# Patient Record
Sex: Female | Born: 1985 | Race: White | Hispanic: No | Marital: Married
Health system: Southern US, Community
[De-identification: ages and names within clinical notes are randomized; demographics above are authoritative.]

---

## 2004-07-21 ENCOUNTER — Other Ambulatory Visit: Payer: Self-pay

## 2004-08-06 ENCOUNTER — Other Ambulatory Visit: Payer: Self-pay

## 2005-01-23 ENCOUNTER — Emergency Department: Payer: Self-pay | Admitting: Unknown Physician Specialty

## 2005-01-23 ENCOUNTER — Other Ambulatory Visit: Payer: Self-pay

## 2005-02-06 ENCOUNTER — Emergency Department: Payer: Self-pay | Admitting: General Practice

## 2005-02-07 ENCOUNTER — Emergency Department: Payer: Self-pay | Admitting: General Practice

## 2005-09-24 ENCOUNTER — Emergency Department: Payer: Self-pay | Admitting: Emergency Medicine

## 2005-09-29 ENCOUNTER — Emergency Department: Payer: Self-pay | Admitting: Emergency Medicine

## 2005-09-30 ENCOUNTER — Emergency Department: Payer: Self-pay | Admitting: Emergency Medicine

## 2005-10-03 ENCOUNTER — Ambulatory Visit: Payer: Self-pay | Admitting: General Surgery

## 2005-10-08 ENCOUNTER — Inpatient Hospital Stay: Payer: Self-pay | Admitting: Internal Medicine

## 2006-08-13 ENCOUNTER — Emergency Department: Payer: Self-pay | Admitting: Emergency Medicine

## 2006-10-10 ENCOUNTER — Emergency Department: Payer: Self-pay | Admitting: Emergency Medicine

## 2007-02-04 ENCOUNTER — Emergency Department: Payer: Self-pay | Admitting: Emergency Medicine

## 2007-05-23 ENCOUNTER — Emergency Department: Payer: Self-pay | Admitting: Emergency Medicine

## 2007-07-23 ENCOUNTER — Ambulatory Visit: Payer: Self-pay | Admitting: Emergency Medicine

## 2007-09-06 IMAGING — CT CT ABD-PELV W/ CM
1 of 2 series · 15 of 32 positions shown, 19 images · non-contrast
Comparison: none

REASON FOR EXAM: (1)  Lower abdominal pain, vomiting (IV contrast only)(2)
same
COMMENTS:   Do 10/09/05 per  Jim

[Series 2: soft tissue · axial · 0.70mm/px · z∈[-522,-142]mm · 15 of 87 slices shown, 19 images]
[im 7/87  soft-tissue]
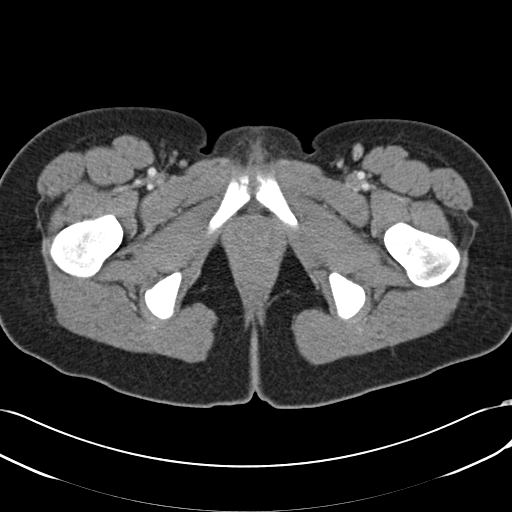
[im 7/87  bone]
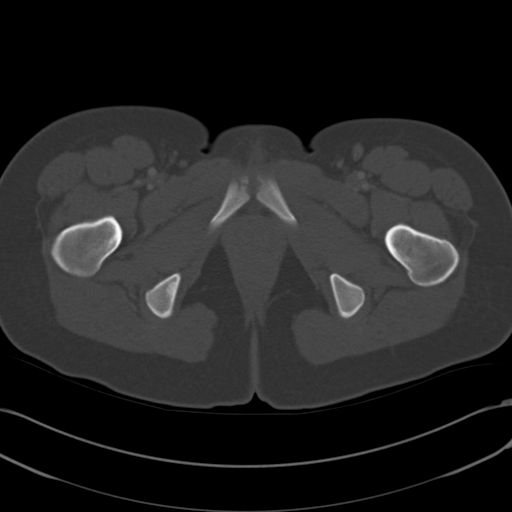
[im 13/87  soft-tissue]
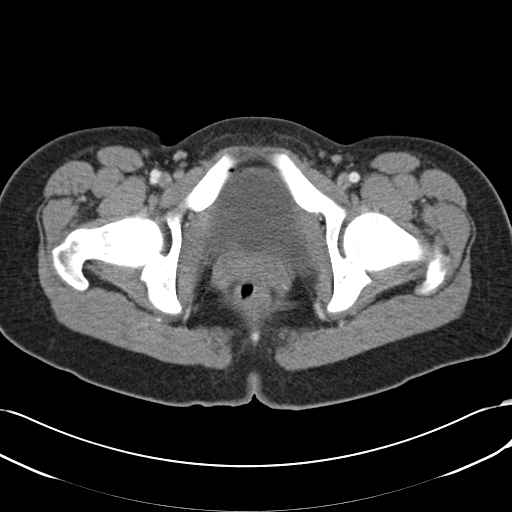
[im 19/87  soft-tissue]
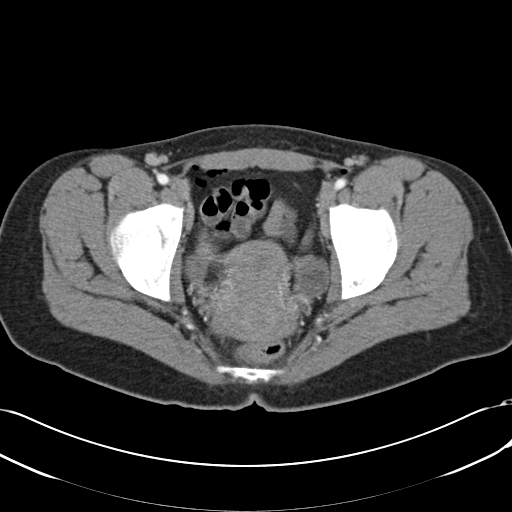
[im 25/87  soft-tissue]
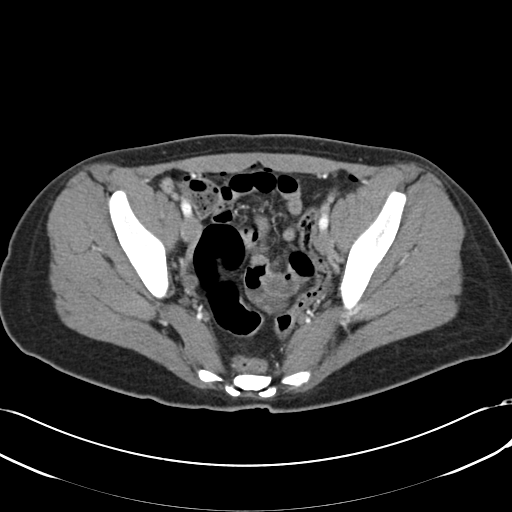
[im 31/87  soft-tissue]
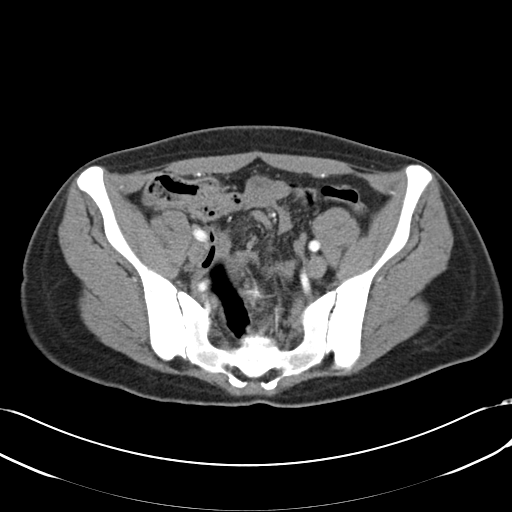
[im 37/87  soft-tissue]
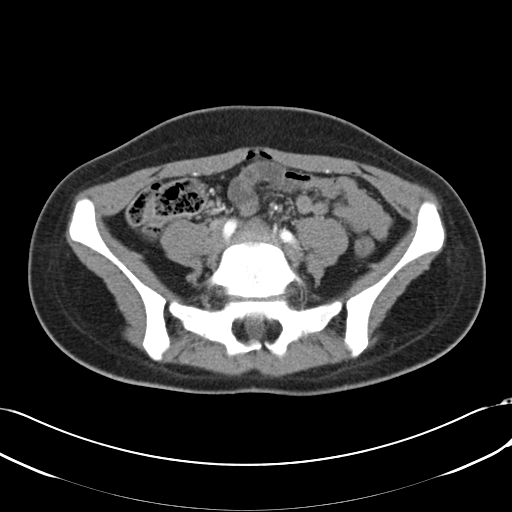
[im 44/87  soft-tissue]
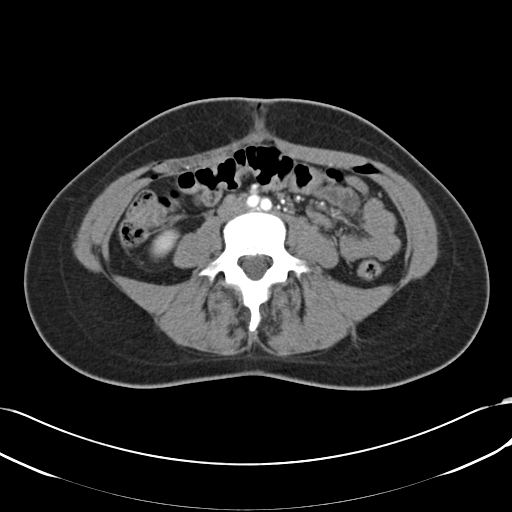
[im 50/87  soft-tissue]
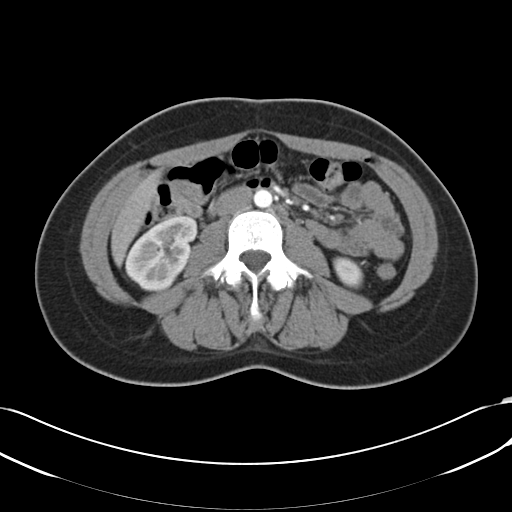
[im 56/87  soft-tissue]
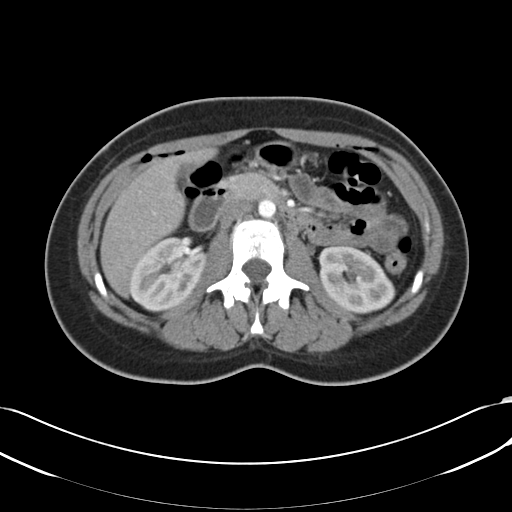
[im 56/87  bone]
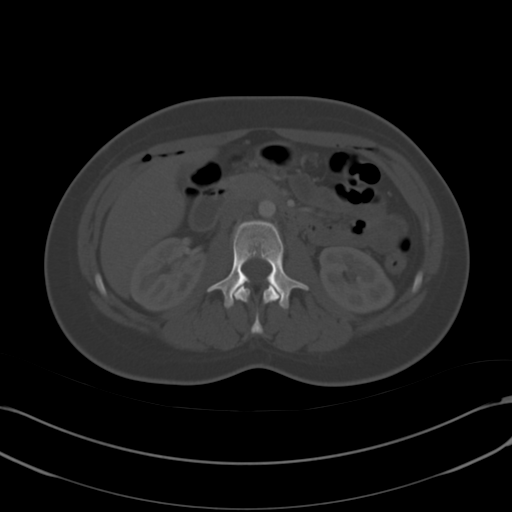
[im 62/87  soft-tissue]
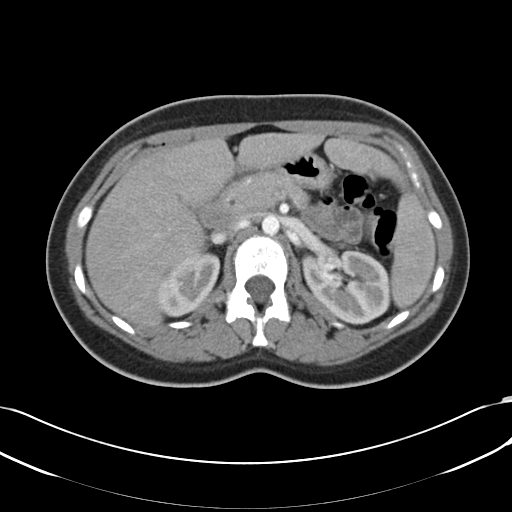
[im 68/87  soft-tissue]
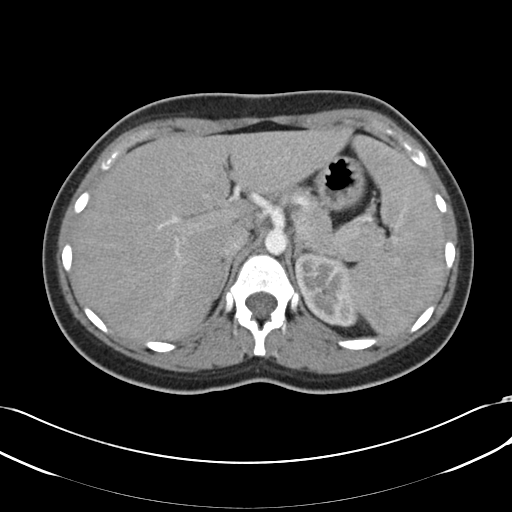
[im 74/87  soft-tissue]
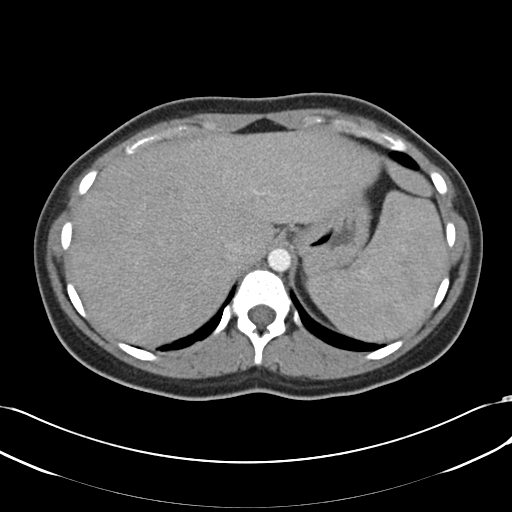
[im 74/87  lung]
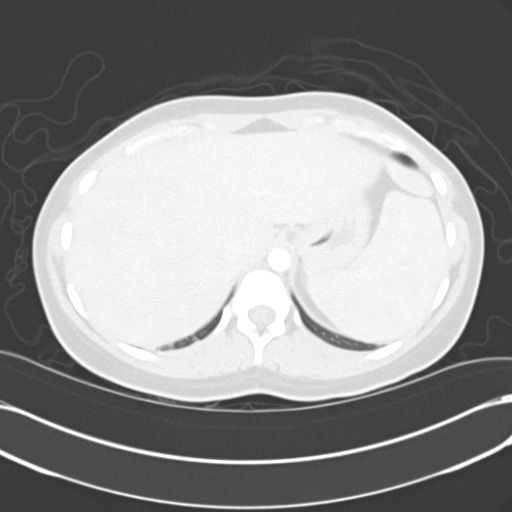
[im 77/87  lung]
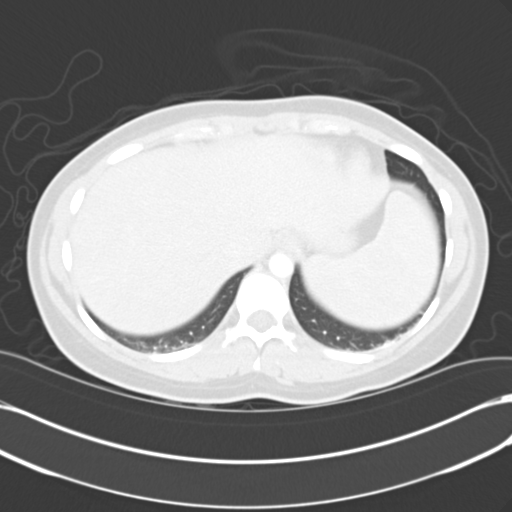
[im 80/87  soft-tissue]
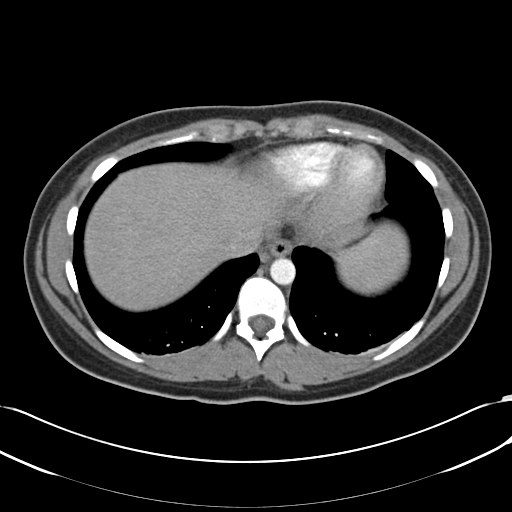
[im 80/87  lung]
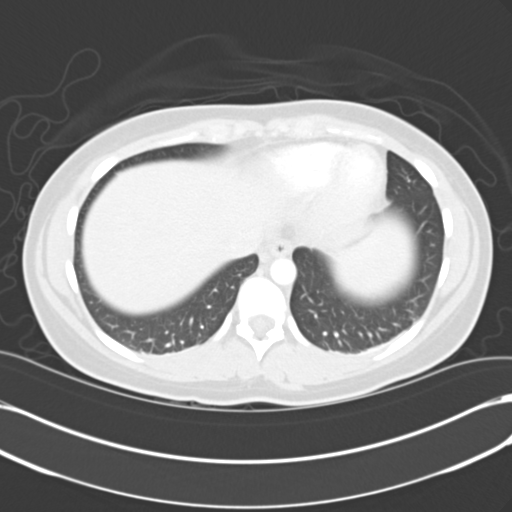
[im 83/87  lung]
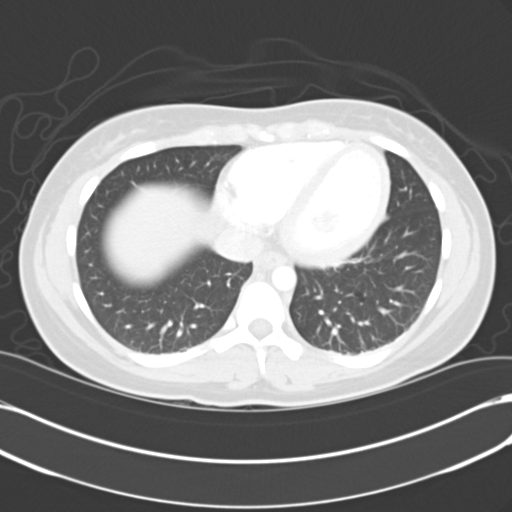

[15 of 32 positions shown; findings below may reference images not displayed]

PROCEDURE:     CT  - CT ABDOMEN / PELVIS  W  - October 09, 2005  [DATE]

RESULT:        IV contrast enhanced CT of the abdomen and pelvis was
performed.  Oral contrast was not administered.  There is intraperitoneal
free air. The appear to be some areas of free air in the subcutaneous
tissue.  The patient has had a recent laparoscopic cholecystectomy and
presumably free air should have resolved within the one week time since that
procedure.  If the procedure was more recent, this could account for some of
the residual free air seen intraperitoneally and in the muscular portion of
the anterior abdominal wall and periumbilical region.  Consideration for
perforation of a loop of bowel should be given but there is no definite
abscess.  Oral contrast would improve sensitivity for evaluation of loops of
bowel.  The aorta appears to be normal.  The kidneys enhance normally.   The
spleen, pancreas and liver are unremarkable.  No pneumothorax is evident.
The lung bases are clear.
IMPRESSION: Some areas of free air within the peritoneum. This may be secondary to the
patient's recent laparoscopic cholecystectomy.  The possibility of bowel
perforation cannot be completely excluded.  There is some air within
portions of the musculature of the anterior abdominal wall and in the
umbilical region in the soft tissue. These may be areas of port placement
during the laparoscopic cholecystectomy.  Again, oral contrast was not given
as per the request.  This would improve sensitivity for evaluation of the
bowel.  There is no evidence of free fluid or abscess formation. Correlation
with the patient's clinical condition would be recommended.  The abnormal
appearance of the RIGHT kidney suggestive of pyelonephritis on the earlier
study of 02/07/05 has resolved.

## 2007-12-02 ENCOUNTER — Ambulatory Visit: Payer: Self-pay | Admitting: Unknown Physician Specialty

## 2008-04-13 ENCOUNTER — Emergency Department: Payer: Self-pay | Admitting: Emergency Medicine

## 2008-09-28 ENCOUNTER — Emergency Department: Payer: Self-pay | Admitting: Emergency Medicine

## 2008-10-02 ENCOUNTER — Emergency Department: Payer: Self-pay | Admitting: Emergency Medicine

## 2008-10-21 ENCOUNTER — Emergency Department: Payer: Self-pay | Admitting: Emergency Medicine

## 2008-10-22 ENCOUNTER — Ambulatory Visit: Payer: Self-pay | Admitting: Unknown Physician Specialty

## 2008-12-19 ENCOUNTER — Emergency Department: Payer: Self-pay | Admitting: Emergency Medicine

## 2009-07-14 ENCOUNTER — Emergency Department: Payer: Self-pay | Admitting: Emergency Medicine

## 2011-06-05 ENCOUNTER — Observation Stay: Payer: Self-pay | Admitting: Surgery

## 2011-08-19 ENCOUNTER — Emergency Department: Payer: Self-pay | Admitting: Emergency Medicine

## 2012-05-25 ENCOUNTER — Emergency Department: Payer: Self-pay | Admitting: Internal Medicine

## 2013-05-22 ENCOUNTER — Emergency Department: Payer: Self-pay | Admitting: Emergency Medicine

## 2014-01-26 ENCOUNTER — Emergency Department: Payer: Self-pay | Admitting: Emergency Medicine

## 2014-05-19 ENCOUNTER — Emergency Department: Payer: Self-pay | Admitting: Emergency Medicine

## 2021-11-29 ENCOUNTER — Emergency Department
Admission: EM | Admit: 2021-11-29 | Discharge: 2021-12-04 | Disposition: E | Payer: Medicaid Other | Attending: Emergency Medicine | Admitting: Emergency Medicine

## 2021-11-29 DIAGNOSIS — I469 Cardiac arrest, cause unspecified: Secondary | ICD-10-CM | POA: Diagnosis not present

## 2021-11-29 MED ORDER — EPINEPHRINE 1 MG/10ML IJ SOSY
PREFILLED_SYRINGE | INTRAMUSCULAR | Status: AC | PRN
  Administered 2021-11-29 (×3): 1 mg via INTRAVENOUS

## 2021-11-29 MED ORDER — SODIUM BICARBONATE 8.4 % IV SOLN
INTRAVENOUS | Status: AC | PRN
  Administered 2021-11-29: 50 meq via INTRAVENOUS

## 2021-11-29 MED ORDER — CALCIUM CHLORIDE 10 % IV SOLN
INTRAVENOUS | Status: AC | PRN
  Administered 2021-11-29: 1 g via INTRAVENOUS

## 2021-11-29 NOTE — ED Triage Notes (Signed)
Pt arrives via [facility] with CPR in progress.

## 2021-11-29 NOTE — Progress Notes (Signed)
Death, prayer emotional support, ministry of presence.

## 2021-11-29 NOTE — ED Provider Notes (Signed)
[facility] Emergency Department Provider Note   ____________________________________________   I have reviewed the triage vital signs and the nursing notes.   HISTORY  Chief Complaint Cardiopulmonary Arrest   History limited by: Cardiopulmonary Arrest   HPI Julie Hull is a 35 y.o. female who presents to the emergency department today via EMS as emergency traffic due to cardiopulmonary arrest.  Report from EMS states that they have been called to her the patient's house earlier in the day for agitation.  At that time the patient was refusing transport to the ED.  She had apparently gone to the doctor's office earlier today for plaints of nausea and vomiting and been given prescription for promethazine for which she took 1 dose.  EMS was then called back to the residence for continued agitation.  When they got there the patient was again agitated.  She did take off all of her clothes secondary to the agitation.  She then stopped breathing.  CPR was initiated promptly by EMS.  They did place an ET tube roughly 40 minutes prior to the arrival to the emergency department.  EMS noted both PEA and asystole on the monitor. Multiple rounds of CPR performed by EMS in the field.    Allergies Patient has no allergy information on record.  No family history on file.   Review of Systems Unable to obtain secondary to cardiopulmonary arrest. ____________________________________________   PHYSICAL EXAM:  Constitutional: Unresponsive. Eyes: Pupils fixed and dilated. ENT      Head: Normocephalic and atraumatic.      Nose: No congestion/rhinnorhea.      Mouth/Throat: Mucous membranes are moist.      Neck: No stridor. Cardiovascular: CPR actively in process. Respiratory: ET tube. Large amount of blood in ET tube. Gastrointestinal: Soft. Genitourinary: Deferred Musculoskeletal: No lower extremity edema. Neurologic:  Unresponsive. Skin:  Some mottling to lower  extremity skin. ____________________________________________    LABS (pertinent positives/negatives)  None  ____________________________________________   EKG  None  ____________________________________________    RADIOLOGY  None  ____________________________________________   PROCEDURES  Procedures  CRITICAL CARE Performed by: Phineas Semen   Total critical care time: 20 minutes  Critical care time was exclusive of separately billable procedures and treating other patients.  Critical care was necessary to treat or prevent imminent or life-threatening deterioration.  Critical care was time spent personally by me on the following activities: development of treatment plan with patient and/or surrogate as well as nursing, discussions with consultants, evaluation of patient's response to treatment, examination of patient, obtaining history from patient or surrogate, ordering and performing treatments and interventions, ordering and review of laboratory studies, ordering and review of radiographic studies, pulse oximetry and re-evaluation of patient's condition.  Cardiopulmonary Resuscitation (CPR) Procedure Note Directed/Performed by: Phineas Semen I personally directed ancillary staff and/or performed CPR in an effort to regain return of spontaneous circulation and to maintain cardiac, neuro and systemic perfusion.   ____________________________________________   INITIAL IMPRESSION / ASSESSMENT AND PLAN / ED COURSE  Pertinent labs & imaging results that were available during my care of the patient were reviewed by me and considered in my medical decision making (see chart for details).   Patient presented to the emergency department undergoing CPR.  CPR had been in process for roughly 15 minutes prior to arrival to the emergency department.  Multiple rounds of CPR performed in the field and ET tube placed in the field. Initial rhythm in ED asystole. Patient had  three rounds of  CPR performed in the emergency department today with continued asystole. Please see nursing notes for exact medications given. Bedside US was performed after the third round and no cardiac activity was noted. Patient was pronounced dead.   ___________________________________________   FINAL CLINICAL IMPRESSION(S) / ED DIAGNOSES  Final diagnoses:  Cardiopulmonary arrest [facility])     Note: This dictation was prepared with Dragon dictation. Any transcriptional errors that result from this process are unintentional     Phineas Semen, MD 04-Dec-2021 [id number]

## 2021-12-04 NOTE — ED Notes (Signed)
[facility] CALLED BY WRITER, SPOKE WITH SISSY KILBY PT ON HOLD FOR TISSUE AND EYE DONATION ([id number])

## 2021-12-04 DEATH — deceased

## 2021-12-06 MED FILL — Medication: Qty: 1 | Status: AC
